# Patient Record
Sex: Female | Born: 1945 | Race: Black or African American | Hispanic: No | Marital: Single | Smoking: Current every day smoker
Health system: Southern US, Community
[De-identification: ages and names within clinical notes are randomized; demographics above are authoritative.]

## PROBLEM LIST (undated history)

## (undated) DIAGNOSIS — F4000 Agoraphobia, unspecified: Secondary | ICD-10-CM

## (undated) DIAGNOSIS — I10 Essential (primary) hypertension: Secondary | ICD-10-CM

## (undated) DIAGNOSIS — F419 Anxiety disorder, unspecified: Secondary | ICD-10-CM

## (undated) HISTORY — PX: ABDOMINAL HYSTERECTOMY: SHX81

---

## 2012-10-31 HISTORY — PX: TOTAL KNEE ARTHROPLASTY: SHX125

## 2013-08-23 ENCOUNTER — Emergency Department (HOSPITAL_COMMUNITY): Payer: Medicare Other

## 2013-08-23 ENCOUNTER — Observation Stay (HOSPITAL_COMMUNITY)
Admission: EM | Admit: 2013-08-23 | Discharge: 2013-08-24 | Disposition: A | Discharge status: Home / Self Care | Payer: Medicare Other | Attending: Family Medicine | Admitting: Family Medicine

## 2013-08-23 ENCOUNTER — Encounter (HOSPITAL_COMMUNITY): Payer: Self-pay | Admitting: Emergency Medicine

## 2013-08-23 DIAGNOSIS — S93402A Sprain of unspecified ligament of left ankle, initial encounter: Secondary | ICD-10-CM

## 2013-08-23 DIAGNOSIS — S82851A Displaced trimalleolar fracture of right lower leg, initial encounter for closed fracture: Secondary | ICD-10-CM

## 2013-08-23 DIAGNOSIS — F411 Generalized anxiety disorder: Secondary | ICD-10-CM | POA: Insufficient documentation

## 2013-08-23 DIAGNOSIS — Z96659 Presence of unspecified artificial knee joint: Secondary | ICD-10-CM | POA: Insufficient documentation

## 2013-08-23 DIAGNOSIS — S82843A Displaced bimalleolar fracture of unspecified lower leg, initial encounter for closed fracture: Principal | ICD-10-CM | POA: Diagnosis present

## 2013-08-23 DIAGNOSIS — S8392XA Sprain of unspecified site of left knee, initial encounter: Secondary | ICD-10-CM

## 2013-08-23 DIAGNOSIS — F17200 Nicotine dependence, unspecified, uncomplicated: Secondary | ICD-10-CM | POA: Insufficient documentation

## 2013-08-23 DIAGNOSIS — F4000 Agoraphobia, unspecified: Secondary | ICD-10-CM | POA: Insufficient documentation

## 2013-08-23 DIAGNOSIS — I10 Essential (primary) hypertension: Secondary | ICD-10-CM | POA: Diagnosis present

## 2013-08-23 DIAGNOSIS — X500XXA Overexertion from strenuous movement or load, initial encounter: Secondary | ICD-10-CM | POA: Insufficient documentation

## 2013-08-23 HISTORY — DX: Agoraphobia, unspecified: F40.00

## 2013-08-23 HISTORY — DX: Essential (primary) hypertension: I10

## 2013-08-23 HISTORY — DX: Anxiety disorder, unspecified: F41.9

## 2013-08-23 LAB — I-STAT CHEM 8, ED
BUN: 11 mg/dL (ref 6–23)
CALCIUM ION: 1.15 mmol/L (ref 1.13–1.30)
Chloride: 108 mEq/L (ref 96–112)
Creatinine, Ser: 0.7 mg/dL (ref 0.50–1.10)
GLUCOSE: 108 mg/dL — AB (ref 70–99)
HEMATOCRIT: 46 % (ref 36.0–46.0)
HEMOGLOBIN: 15.6 g/dL — AB (ref 12.0–15.0)
Potassium: 4.9 mEq/L (ref 3.7–5.3)
Sodium: 141 mEq/L (ref 137–147)
TCO2: 23 mmol/L (ref 0–100)

## 2013-08-23 LAB — CBC WITH DIFFERENTIAL/PLATELET
BASOS PCT: 0 % (ref 0–1)
Basophils Absolute: 0 10*3/uL (ref 0.0–0.1)
EOS ABS: 0.1 10*3/uL (ref 0.0–0.7)
EOS PCT: 1 % (ref 0–5)
HEMATOCRIT: 41.6 % (ref 36.0–46.0)
HEMOGLOBIN: 14.1 g/dL (ref 12.0–15.0)
LYMPHS ABS: 2.8 10*3/uL (ref 0.7–4.0)
Lymphocytes Relative: 31 % (ref 12–46)
MCH: 30.1 pg (ref 26.0–34.0)
MCHC: 33.9 g/dL (ref 30.0–36.0)
MCV: 88.7 fL (ref 78.0–100.0)
MONO ABS: 0.7 10*3/uL (ref 0.1–1.0)
MONOS PCT: 8 % (ref 3–12)
Neutro Abs: 5.5 10*3/uL (ref 1.7–7.7)
Neutrophils Relative %: 60 % (ref 43–77)
Platelets: 214 10*3/uL (ref 150–400)
RBC: 4.69 MIL/uL (ref 3.87–5.11)
RDW: 15.4 % (ref 11.5–15.5)
WBC: 9.1 10*3/uL (ref 4.0–10.5)

## 2013-08-23 MED ORDER — OXYCODONE-ACETAMINOPHEN 5-325 MG PO TABS
1.0000 | ORAL_TABLET | Freq: Once | ORAL | Status: AC
  Administered 2013-08-23: 1 via ORAL
  Filled 2013-08-23: qty 1

## 2013-08-23 MED ORDER — HYDROMORPHONE HCL PF 1 MG/ML IJ SOLN
1.0000 mg | Freq: Once | INTRAMUSCULAR | Status: AC
  Administered 2013-08-23: 1 mg via INTRAVENOUS
  Filled 2013-08-23: qty 1

## 2013-08-23 MED ORDER — FENTANYL CITRATE 0.05 MG/ML IJ SOLN
50.0000 ug | Freq: Once | INTRAMUSCULAR | Status: AC
  Administered 2013-08-23: 50 ug via INTRAVENOUS
  Filled 2013-08-23: qty 2

## 2013-08-23 NOTE — Progress Notes (Signed)
  CARE MANAGEMENT ED NOTE 08/23/2013  Patient:  Larina EarthlyDUNMORE,Jennessa   Account Number:  [id number]  Date Initiated:  08/23/2013  Documentation initiated by:  Radford PaxFERRERO,Onur Mori  Subjective/Objective Assessment:   Patient presents to Ed with bilateral ankle pain     Subjective/Objective Assessment Detail:   xray of right ankle revealed:    1. Bimalleolar fracture. Fracture fragments are slightly displaced.  A subtle posterior malleolar component cannot be excluded.  2. DJD.  Patient has history of left kne replacement.     Action/Plan:   Action/Plan Detail:   Anticipated DC Date:       Status Recommendation to Physician:   Result of Recommendation:    Other ED Services  Consult Working Plan    DC Planning Services  CM consult  Other    Choice offered to / List presented to:    DME arranged  WHEELCHAIR - MANUAL     DME agency  [facility]  [facility]        Status of service:  Completed, signed off  ED Comments:   ED Comments Detail:  [facility] to [facility] patient regarding a wheelchair.  Patient lost her balance walking up the stairs and broke her right ankle.  Patient's friends willing to take her home, only the patient needs a wheelchair. Patient is from out of state, lives in [place].  EDCM spoke to patient's friends at bedside who report the patient's sister will be oming to pick her up on Monday. Patient would like to see her orthopedist in [place]. Patient is 5'6' 255pounds.  DME order placed for wheelchiar and faxed over to [facility] at 1840pm with confirmation of receipt at 1844pm, with urgent message written on face sheet [facility] received a phone call from Villa PanchoJason of [facility] at 1857pm who reports they will not be able to deliver the wheelchair this evening because the patient's address is not within their service area.  Barbara CowerJason of [facility] suggested [facility].  EDCM called [facility] at phone number [phone] and  spoke to [place]Erica who took all of patient's demographic information and insurance information.  EDCM was informed by Alcario DroughtErica to fax face sheet, doctors notes, insurance information and prescriptions to phone number [phone].  [facility] faxed the information requested at 1926pm with confirmation of receipt at 1931pm.  [facility] made patient and her friends aware of the above situation.  As per Janna Archracy of [facility], wheelchair may be delivered this evening, they have to process the order and wait for insurance authorization.  French Anaracy provided this phone number to follow up on process of wheelchair order [phone]. No further EDCM needs at this time.

## 2013-08-23 NOTE — ED Provider Notes (Signed)
CSN: [id number]     Arrival date & time 08/23/13  1714 History   First MD Initiated Contact with Patient 08/23/13 1721     Chief Complaint  Patient presents with  . Fall  . Ankle Injury     (Consider location/radiation/quality/duration/timing/severity/associated sxs/prior Treatment) HPI Erin Rose is a 68 y.o. female who presents to ED with complaint of a fall. Pt states she was walking up the stairs and fell twisting bilateral ankles and injuring left knee. Pt denies dizziness, light headiness, syncope. Pt denies any other injuries. No pain in her back, neck, head. Did not hit head or have LOC. Denies hip or pelvis pain. Hx of bilateral ankle fractures and left knee replacement. Pt was immobilized by EMS, possible unstable left ankle joint. Was treated with fentanyl for pain.    Past Medical History  Diagnosis Date  . Hypertension   . Anxiety   . Agoraphobia    Past Surgical History  Procedure Laterality Date  . Total knee arthroplasty    . Abdominal hysterectomy     No family history on file. History  Substance Use Topics  . Smoking status: Current Every Day Smoker -- 1.00 packs/day    Types: Cigarettes  . Smokeless tobacco: Not on file  . Alcohol Use: Yes     Comment: occ.   OB History   Grav Para Term Preterm Abortions TAB SAB Ect Mult Living                 Review of Systems  Constitutional: Negative for fever and chills.  Respiratory: Negative for cough, chest tightness and shortness of breath.   Cardiovascular: Negative for chest pain, palpitations and leg swelling.  Gastrointestinal: Negative for nausea, vomiting, abdominal pain and diarrhea.  Genitourinary: Negative for dysuria and flank pain.  Musculoskeletal: Positive for arthralgias and joint swelling. Negative for myalgias, neck pain and neck stiffness.  Skin: Negative for rash.  Neurological: Negative for dizziness, weakness and headaches.  All other systems reviewed and are  negative.     Allergies  Review of patient's allergies indicates no known allergies.  Home Medications   Prior to Admission medications   Not on File   There were no vitals taken for this visit. Physical Exam  Nursing note and vitals reviewed. Constitutional: She appears well-developed and well-nourished. No distress.  HENT:  Head: Normocephalic.  Eyes: Conjunctivae are normal.  Neck: Neck supple.  Pulmonary/Chest: Effort normal. No respiratory distress.  Abdominal: She exhibits no distension.  Musculoskeletal: She exhibits no edema.  Swelling over bilateral ankle joints. Tender to palpation over right and left lateral maleoli. Pain with any ROM at ankle joints. Dorsal pedal pulses intact bilaterally. Achilles tendons intact bilaterally. Left knee normal appearing. Tender to palpation over anterior knee. No medial or lateral joint tenderness. Did not check ROM due to pain.   Neurological: She is alert.  Skin: Skin is warm and dry.  Psychiatric: She has a normal mood and affect. Her behavior is normal.    ED Course  Procedures (including critical care time) Labs Review Labs Reviewed  I-STAT CHEM 8, ED - Abnormal; Notable for the following:    Glucose, Bld 108 (*)    Hemoglobin 15.6 (*)    All other components within normal limits  CBC WITH DIFFERENTIAL    Imaging Review Dg Ankle Complete Left  08/23/2013   CLINICAL DATA:  Ankle pain after falling today.  EXAM: LEFT ANKLE COMPLETE - 3+ VIEW  COMPARISON:  None.  FINDINGS: The bones appear mildly demineralized. There is no evidence of acute fracture or dislocation. There is mild spurring of both malleoli and moderate calcaneal spurring. The soft tissues surrounding the ankle are diffusely prominent without apparent focal swelling.  IMPRESSION: No acute osseous findings.   Electronically Signed   By: Roxy HorsemanBill  Veazey M.D.   On: 08/23/2013 17:59   Dg Ankle Complete Right  08/23/2013   CLINICAL DATA:  Fall.  EXAM: RIGHT ANKLE -  COMPLETE 3+ VIEW  COMPARISON:  None.  FINDINGS: Severe soft tissue swelling present. Bimalleolar fractures are present. The fractures are slightly displaced. Trimalleolar component cannot be completely excluded. Diffuse osteopenia and degenerative change present.  IMPRESSION: 1. Bimalleolar fracture. Fracture fragments are slightly displaced. A subtle posterior malleolar component cannot be excluded. 2. DJD.   Electronically Signed   By: Maisie Fushomas  Register   On: 08/23/2013 18:00   Dg Knee Complete 4 Views Left  08/23/2013   CLINICAL DATA:  Fall.  Left knee pain.  EXAM: LEFT KNEE - COMPLETE 4+ VIEW  COMPARISON:  None.  FINDINGS: Changes of left knee replacement. No hardware or bony complicating feature. No fracture, subluxation or dislocation.  IMPRESSION: No acute bony abnormality.   Electronically Signed   By: Charlett NoseKevin  Dover M.D.   On: 08/23/2013 17:59     EKG Interpretation None      MDM   Final diagnoses:  Trimalleolar fracture of right ankle  Sprain of left ankle  Sprain of left knee    Patient bilateral ankle and left knee injuries. X-rays obtained, the right ankle unstable possible a trimalleolar fracture. Left ankle and knee x-rays negative. Patient unable to bear weight to both legs. Patient is from out of town, from [place]. She does have orthopedics Dr. there. Patient is to go back in 3 days. Offered to call in on-call orthopedist Dr. here, however patient would prefer to followup with her orthopedic Dr. when she gets back home. Explained to her she will need surgery. This time we'll try to get patient a wheelchair to go home with. She is unable to walk with crutches or walker.  9:04 PM Spoke with Dr. Luiz BlareGraves, advised splinting, admit for pain management to medicine if needed, he will be happy to operate next week if pt chooses to stay in [place], otherwise disposition home.   Difficulty getting a wheel chair, Child psychotherapistsocial worker and case manager working on it. Pt admitted for pain  management   Filed Vitals:   08/23/13 1745 08/23/13 1813 08/23/13 2039 08/23/13 2330  BP: 148/89 160/87 159/68 177/62  Pulse: 88 79 80 59  Temp: 98.4 F (36.9 C) 98 F (36.7 C) 97.9 F (36.6 C) 98.1 F (36.7 C)  TempSrc:   Oral Oral  Resp: 18 18 20 20   Height:  5\' 6"  (1.676 m)    Weight:  255 lb (115.667 kg)    SpO2: 100% 97% 95% 96%     Josanne Boerema A Silvano Garofano, PA-C 08/24/13 0100

## 2013-08-23 NOTE — ED Notes (Signed)
Ortho tech at bedside 

## 2013-08-23 NOTE — ED Notes (Signed)
Bed: ZO10WA10 Expected date:  Expected time:  Means of arrival:  Comments: ems- fall

## 2013-08-23 NOTE — ED Notes (Addendum)
Per EMS pt was walking up the stairs, lost balance, fell, reports bilateral ankle pain, per EMS weak pedal pulses, "right ankle unstable" fentanyl given en route

## 2013-08-24 ENCOUNTER — Encounter (HOSPITAL_COMMUNITY): Payer: Self-pay | Admitting: Internal Medicine

## 2013-08-24 DIAGNOSIS — S82843A Displaced bimalleolar fracture of unspecified lower leg, initial encounter for closed fracture: Secondary | ICD-10-CM

## 2013-08-24 DIAGNOSIS — S82853A Displaced trimalleolar fracture of unspecified lower leg, initial encounter for closed fracture: Secondary | ICD-10-CM

## 2013-08-24 DIAGNOSIS — I10 Essential (primary) hypertension: Secondary | ICD-10-CM | POA: Diagnosis present

## 2013-08-24 DIAGNOSIS — S93409A Sprain of unspecified ligament of unspecified ankle, initial encounter: Secondary | ICD-10-CM

## 2013-08-24 LAB — COMPREHENSIVE METABOLIC PANEL
ALK PHOS: 76 U/L (ref 39–117)
ALT: 12 U/L (ref 0–35)
AST: 13 U/L (ref 0–37)
Albumin: 3.2 g/dL — ABNORMAL LOW (ref 3.5–5.2)
BILIRUBIN TOTAL: 0.5 mg/dL (ref 0.3–1.2)
BUN: 8 mg/dL (ref 6–23)
CO2: 21 mEq/L (ref 19–32)
Calcium: 8.8 mg/dL (ref 8.4–10.5)
Chloride: 104 mEq/L (ref 96–112)
Creatinine, Ser: 0.54 mg/dL (ref 0.50–1.10)
GFR calc non Af Amer: >90 mL/min (ref >90)
Glucose, Bld: 108 mg/dL — ABNORMAL HIGH (ref 70–99)
POTASSIUM: 3.4 meq/L — AB (ref 3.7–5.3)
Sodium: 138 mEq/L (ref 137–147)
TOTAL PROTEIN: 6.9 g/dL (ref 6.0–8.3)

## 2013-08-24 LAB — CBC
HEMATOCRIT: 39.8 % (ref 36.0–46.0)
Hemoglobin: 13.7 g/dL (ref 12.0–15.0)
MCH: 30.7 pg (ref 26.0–34.0)
MCHC: 34.4 g/dL (ref 30.0–36.0)
MCV: 89.2 fL (ref 78.0–100.0)
Platelets: 195 10*3/uL (ref 150–400)
RBC: 4.46 MIL/uL (ref 3.87–5.11)
RDW: 15.6 % — AB (ref 11.5–15.5)
WBC: 7.9 10*3/uL (ref 4.0–10.5)

## 2013-08-24 LAB — MAGNESIUM: Magnesium: 1.8 mg/dL (ref 1.5–2.5)

## 2013-08-24 LAB — TSH: TSH: 3.5 u[IU]/mL (ref 0.350–4.500)

## 2013-08-24 LAB — PHOSPHORUS: Phosphorus: 2.6 mg/dL (ref 2.3–4.6)

## 2013-08-24 MED ORDER — HYDROMORPHONE HCL PF 1 MG/ML IJ SOLN
0.5000 mg | INTRAMUSCULAR | Status: DC | PRN
  Administered 2013-08-24 (×2): 1 mg via INTRAVENOUS
  Filled 2013-08-24 (×2): qty 1

## 2013-08-24 MED ORDER — LOSARTAN POTASSIUM 25 MG PO TABS
25.0000 mg | ORAL_TABLET | Freq: Every day | ORAL | Status: DC
  Administered 2013-08-24: 25 mg via ORAL
  Filled 2013-08-24: qty 1

## 2013-08-24 MED ORDER — OXYCODONE-ACETAMINOPHEN 5-325 MG PO TABS
1.0000 | ORAL_TABLET | ORAL | Status: DC | PRN
  Administered 2013-08-24 (×2): 1 via ORAL
  Filled 2013-08-24 (×3): qty 1

## 2013-08-24 MED ORDER — ACETAMINOPHEN 325 MG PO TABS: 650.0000 mg | ORAL_TABLET | Freq: Four times a day (QID) | ORAL | Status: DC | PRN

## 2013-08-24 MED ORDER — OXYCODONE-ACETAMINOPHEN 5-325 MG PO TABS: 1.0000 | ORAL_TABLET | ORAL | Status: AC | PRN

## 2013-08-24 MED ORDER — DOCUSATE SODIUM 100 MG PO CAPS
100.0000 mg | ORAL_CAPSULE | Freq: Two times a day (BID) | ORAL | Status: DC
  Administered 2013-08-24: 100 mg via ORAL

## 2013-08-24 MED ORDER — ACETAMINOPHEN 650 MG RE SUPP: 650.0000 mg | Freq: Four times a day (QID) | RECTAL | Status: DC | PRN

## 2013-08-24 MED ORDER — NIFEDIPINE ER OSMOTIC RELEASE 90 MG PO TB24
90.0000 mg | ORAL_TABLET | Freq: Every day | ORAL | Status: DC
  Administered 2013-08-24: 90 mg via ORAL
  Filled 2013-08-24: qty 1

## 2013-08-24 MED ORDER — ENOXAPARIN SODIUM 40 MG/0.4ML ~~LOC~~ SOLN
40.0000 mg | Freq: Every day | SUBCUTANEOUS | Status: DC
  Administered 2013-08-24: 40 mg via SUBCUTANEOUS
  Filled 2013-08-24 (×2): qty 0.4

## 2013-08-24 MED ORDER — CITALOPRAM HYDROBROMIDE 20 MG PO TABS
20.0000 mg | ORAL_TABLET | Freq: Every day | ORAL | Status: DC
  Administered 2013-08-24: 20 mg via ORAL
  Filled 2013-08-24: qty 1

## 2013-08-24 MED ORDER — ONDANSETRON HCL 4 MG/2ML IJ SOLN
4.0000 mg | Freq: Four times a day (QID) | INTRAMUSCULAR | Status: DC | PRN
Start: 2013-08-24 — End: 2013-08-24

## 2013-08-24 MED ORDER — ONDANSETRON HCL 4 MG PO TABS: 4.0000 mg | ORAL_TABLET | Freq: Four times a day (QID) | ORAL | Status: DC | PRN

## 2013-08-24 MED ORDER — LORAZEPAM 1 MG PO TABS: 1.0000 mg | ORAL_TABLET | Freq: Four times a day (QID) | ORAL | Status: AC | PRN

## 2013-08-24 MED ORDER — OXYCODONE-ACETAMINOPHEN 5-325 MG PO TABS
1.0000 | ORAL_TABLET | ORAL | Status: DC | PRN
  Administered 2013-08-24 (×2): 1 via ORAL
  Filled 2013-08-24: qty 1

## 2013-08-24 MED ORDER — LORAZEPAM 1 MG PO TABS
1.0000 mg | ORAL_TABLET | Freq: Four times a day (QID) | ORAL | Status: DC | PRN
  Administered 2013-08-24: 1 mg via ORAL
  Filled 2013-08-24: qty 1

## 2013-08-24 MED ORDER — BUSPIRONE HCL 15 MG PO TABS
15.0000 mg | ORAL_TABLET | Freq: Three times a day (TID) | ORAL | Status: DC
  Administered 2013-08-24: 15 mg via ORAL
  Filled 2013-08-24 (×3): qty 1

## 2013-08-24 NOTE — Progress Notes (Signed)
Contacted [facility] for wheelchair for home. Tariya Morrissette RN CCM Case Mgmt phone [phone] 

## 2013-08-24 NOTE — Progress Notes (Signed)
Clinical Social Work Department BRIEF PSYCHOSOCIAL ASSESSMENT 08/24/2013  Patient:  Erin Rose, Erin Rose     Account Number:  [id number]     Admit date:  08/23/2013  Clinical Social Worker:  Levie Heritage  Date/Time:  08/24/2013 11:03 AM  Referred by:  Physician  Date Referred:  08/24/2013 Referred for  Transportation assistance   Other Referral:   Interview type:  Patient Other interview type:    PSYCHOSOCIAL DATA Living Status:  FAMILY Admitted from facility:   Level of care:   Primary support name:  Olivia Mackie Primary support relationship to patient:  FRIEND Degree of support available:   strong    CURRENT CONCERNS Current Concerns  Other - See comment   Other Concerns:   Transportation    SOCIAL WORK ASSESSMENT / PLAN CSW met with Pt to discuss d/c.    Pt stated that she was here from [place] for her niece's wedding.  Pt stated that she came by train and that she has a ticket to go back by train on Tuesday but ste doesn't feel that she can withstand travel by train or car.  Pt stated that she feels that she needs to be transported by ambulance.  Her daughter is at her home and will receive her.  From there, she will seek treatment for her injuries.    Pt asked CSW to arrange ambulance transport, should MD feel that Pt is ready.  Pt is wanting to know an approximate ambulance charge.    CSW will look into this.    CSW thanked Pt for her time.    Per MD, Pt can d/c today.    CSW spoke with Midwife.  Informed that [facility] charges a flat rate of $275 and $9.50 per mile.  Pt's total came to approx $1,624.    Notified Pt with MD present.  Pt voiced an understanding and asked CSW to proceed with arranging transportation.    CSW arranged for ambulance transportation home.    No further CSW needs identified.    CSW to sign off.   Assessment/plan status:  No Further Intervention Required Other assessment/ plan:   Information/referral to community resources:    n/a    PATIENT'S/FAMILY'S RESPONSE TO PLAN OF CARE: Pt was calm, cooperative and very pleasant.  Pt is happy to d/c home and, although she isn't too thrilled about the cost, she did state that it was less than she anticipated.   Bernita Raisin, [place] Social Work [phone]

## 2013-08-24 NOTE — H&P (Signed)
PCP: Erin Rose   Chief Complaint:  Leg pain  HPI: Erin Rose is a 68 y.o. female   has a past medical history of Hypertension; Anxiety; and Agoraphobia.   Presented with  patient visiting from [place] had a mechanical fall resulting in Right Bimalleolar ankle fracture. She is wishing to return home to [place] for operative intervention. ER spoke to Orthopedics who feels no emergent repair needed at his time. patient was placed in a splint. She is been placed in observation for pain control until arrangement for transportation to [place] is established. She is sp Left knee replacement 10 months ago and have had some left knee and left ankle pain as well.   Hospitalist was called for admission for pain control  Review of Systems:    Pertinent positives include:  Leg pain  Constitutional:  No weight loss, night sweats, Fevers, chills, fatigue, weight loss  HEENT:  No headaches, Difficulty swallowing,Tooth/dental problems,Sore throat,  No sneezing, itching, ear ache, nasal congestion, post nasal drip,  Cardio-vascular:  No chest pain, Orthopnea, PND, anasarca, dizziness, palpitations.no Bilateral lower extremity swelling  GI:  No heartburn, indigestion, abdominal pain, nausea, vomiting, diarrhea, change in bowel habits, loss of appetite, melena, blood in stool, hematemesis Resp:  no shortness of breath at rest. No dyspnea on exertion, No excess mucus, no productive cough, No non-productive cough, No coughing up of blood.No change in color of mucus.No wheezing. Skin:  no rash or lesions. No jaundice GU:  no dysuria, change in color of urine, no urgency or frequency. No straining to urinate.  No flank pain.  Musculoskeletal:  No joint pain or no joint swelling. No decreased range of motion. No back pain.  Psych:  No change in mood or affect. No depression or anxiety. No memory loss.  Neuro: no localizing neurological complaints, no tingling, no weakness, no double  vision, no gait abnormality, no slurred speech, no confusion  Otherwise ROS are negative except for above, 10 systems were reviewed  Past Medical History: Past Medical History  Diagnosis Date  . Hypertension   . Anxiety   . Agoraphobia    Past Surgical History  Procedure Laterality Date  . Total knee arthroplasty Left 10/31/2012  . Abdominal hysterectomy       Medications: Prior to Admission medications   Medication Sig Start Date End Date Taking? Authorizing Provider  busPIRone (BUSPAR) 15 MG tablet Take 15 mg by mouth 3 (three) times daily.   Yes Historical Provider, MD  citalopram (CELEXA) 20 MG tablet Take 20 mg by mouth daily.   Yes Historical Provider, MD  LORazepam (ATIVAN) 1 MG tablet Take 1 mg by mouth 4 (four) times daily.   Yes Historical Provider, MD  losartan (COZAAR) 25 MG tablet Take 25 mg by mouth daily.   Yes Historical Provider, MD  NIFEdipine (PROCARDIA XL/ADALAT-CC) 90 MG 24 hr tablet Take 90 mg by mouth daily.   Yes Historical Provider, MD  pravastatin (PRAVACHOL) 20 MG tablet Take 20 mg by mouth every evening.   Yes Historical Provider, MD    Allergies:  No Known Allergies  Social History:  Ambulatory cane,  Lives at home  With family    reports that she has been smoking Cigarettes.  She has been smoking about 1.00 pack per day. She does not have any smokeless tobacco history on file. She reports that she drinks alcohol. She reports that she does not use illicit drugs.    Family History: family history includes Breast cancer  in her daughter; Dementia in her mother; Prostate cancer in her son; Stroke in her mother.    Physical Exam: Patient Vitals for the past 24 hrs:  BP Temp Temp src Pulse Resp SpO2 Height Weight  08/23/13 2330 177/62 mmHg 98.1 F (36.7 C) Oral 59 20 96 % - -  08/23/13 2039 159/68 mmHg 97.9 F (36.6 C) Oral 80 20 95 % - -  08/23/13 1813 160/87 mmHg 98 F (36.7 C) - 79 18 97 % 5\' 6"  (1.676 m) 115.667 kg (255 lb)  08/23/13  1745 148/89 mmHg 98.4 F (36.9 C) - 88 18 100 % - -    1. General:  in No Acute distress 2. Psychological: Alert and   Oriented 3. Head/ENT:   Moist  Mucous Membranes                          Head Non traumatic, neck supple                          Normal   Dentition 4. SKIN: normal Skin turgor,  Skin clean Dry and intact no rash 5. Heart: Regular rate and rhythm no Murmur, Rub or gallop 6. Lungs: Clear to auscultation bilaterally, no wheezes or crackles   7. Abdomen: Soft, non-tender, Non distended 8. Lower extremities: no clubbing, cyanosis, or edema, left knee with well healing scar from knee replacement, a brace on her left ankle. Right ankle in a cast 9. Neurologically Grossly intact, moving all 4 extremities equally limited by pain 10. MSK: Normal range of motion  body mass index is 41.18 kg/(m^2).   Labs on Admission:   Recent Labs  08/23/13 2233  NA 141  K 4.9  CL 108  GLUCOSE 108*  BUN 11  CREATININE 0.70   No results found for this basename: AST, ALT, ALKPHOS, BILITOT, PROT, ALBUMIN,  in the last 72 hours No results found for this basename: LIPASE, AMYLASE,  in the last 72 hours  Recent Labs  08/23/13 2220 08/23/13 2233  WBC 9.1  --   NEUTROABS 5.5  --   HGB 14.1 15.6*  HCT 41.6 46.0  MCV 88.7  --   PLT 214  --    No results found for this basename: CKTOTAL, CKMB, CKMBINDEX, TROPONINI,  in the last 72 hours No results found for this basename: TSH, T4TOTAL, FREET3, T3FREE, THYROIDAB,  in the last 72 hours No results found for this basename: VITAMINB12, FOLATE, FERRITIN, TIBC, IRON, RETICCTPCT,  in the last 72 hours No results found for this basename: HGBA1C    Estimated Creatinine Clearance: 88.2 ml/min (by C-G formula based on Cr of 0.7). ABG    Component Value Date/Time   TCO2 23 08/23/2013 2233     No results found for this basename: DDIMER   BNP (last 3 results) No results found for this basename: PROBNP,  in the last 8760 hours  Filed  Weights   08/23/13 1813  Weight: 115.667 kg (255 lb)     Cultures: No results found for this basename: sdes, specrequest, cult, reptstatus   Radiological Exams on Admission: Dg Ankle Complete Left  08/23/2013   CLINICAL DATA:  Ankle pain after falling today.  EXAM: LEFT ANKLE COMPLETE - 3+ VIEW  COMPARISON:  None.  FINDINGS: The bones appear mildly demineralized. There is no evidence of acute fracture or dislocation. There is mild spurring of both malleoli and moderate calcaneal  spurring. The soft tissues surrounding the ankle are diffusely prominent without apparent focal swelling.  IMPRESSION: No acute osseous findings.   Electronically Signed   By: Roxy HorsemanBill  Veazey M.D.   On: 08/23/2013 17:59   Dg Ankle Complete Right  08/23/2013   CLINICAL DATA:  Fall.  EXAM: RIGHT ANKLE - COMPLETE 3+ VIEW  COMPARISON:  None.  FINDINGS: Severe soft tissue swelling present. Bimalleolar fractures are present. The fractures are slightly displaced. Trimalleolar component cannot be completely excluded. Diffuse osteopenia and degenerative change present.  IMPRESSION: 1. Bimalleolar fracture. Fracture fragments are slightly displaced. A subtle posterior malleolar component cannot be excluded. 2. DJD.   Electronically Signed   By: Maisie Fushomas  Register   On: 08/23/2013 18:00   Dg Knee Complete 4 Views Left  08/23/2013   CLINICAL DATA:  Fall.  Left knee pain.  EXAM: LEFT KNEE - COMPLETE 4+ VIEW  COMPARISON:  None.  FINDINGS: Changes of left knee replacement. No hardware or bony complicating feature. No fracture, subluxation or dislocation.  IMPRESSION: No acute bony abnormality.   Electronically Signed   By: Charlett NoseKevin  Dover M.D.   On: 08/23/2013 17:59    Chart has been reviewed  Assessment/Plan 68 year old female history of hypertension present soft mechanical fall resulting in right ankle fracture and left ankle sprain unable to weight-bear as needed for pain control with plan to be transported back to [place] for further  intervention  Present on Admission:  . Ankle fracture, bimalleolar, closed - ER has spoken to orthopedics. At this point there is no need for emergent operative intervention. Patient requests transfer to [place]. We will achieve pain control social work consult for transportation needs  . Hypertension continue home medications   Prophylaxis:   Lovenox    CODE STATUS:  FULL CODE     Other plan as per orders.  I have spent a total of 55 min on this admission  Sammie Schermerhorn 08/24/2013, 12:16 AM  [facility]  Pager [phone]   If 7AM-7PM, please contact the day team taking care of the patient  [email or web address]  Password [email or web address]

## 2013-08-24 NOTE — Progress Notes (Signed)
Patient discharged to home in [place].  Discharge paperwork reviewed with patient by previous RN Malen GauzeKim G.  Prescription for Percocet given to patient.  [facility]  Patient escorted from floor via stretcher by ambulance service.  Dorothyann PengKim Talana Slatten RN

## 2013-08-24 NOTE — ED Provider Notes (Signed)
Medical screening examination/treatment/procedure(s) were performed by non-physician practitioner and as supervising physician I was immediately available for consultation/collaboration.   EKG Interpretation None        Richardean Canalavid H Yao, MD 08/24/13 1504

## 2013-08-24 NOTE — Progress Notes (Signed)
08/24/2013 1630 [facility] delivered wheelchair to room. Ambulance unable to transport wheelchair. [facility] cannot deliver to [place]. Contacted [facility] and they are not able to deliver wheelchair until Monday, 08/24/2013. Will follow up with [facility] for delivery of DME tonight. Spoke to rep for [employer] [facility] and they do not cover that area. Attempted call to DME in [place]. [facility] No answer. Will follow up 08/25/2013. Isidoro DonningAlesia Cannan Beeck RN CCM Case Mgmt phone [phone]

## 2013-08-24 NOTE — Discharge Summary (Signed)
Physician Discharge Summary  Erin Rose AVW:[id number] DOB: [birth year] DOA: 08/23/2013  PCP: No primary provider on file.  Admit date: 08/23/2013 Discharge date: 08/24/2013  Time spent: > 35 minutes  Recommendations for Outpatient Follow-up:  1. Please be sure to follow up with your orthopaedic surgeon for further evaluation and recommendations.  Discharge Diagnoses:  Active Problems:   Ankle fracture, bimalleolar, closed   Hypertension   Discharge Condition: stable  Diet recommendation: low sodium heart healthy  Filed Weights   08/23/13 1813  Weight: 115.667 kg (255 lb)    History of present illness:  68 y/o patient visiting from [place] had a mechanical fall resulting in Right Bimalleolar ankle fracture. Was admitted for pain control.  Hospital Course:  Right bimalleolar ankle fracture - Will prescribe Percocet for pain control. I have advised patient to take her anxiety medication as needed and to avoid taking it if possible given that combination of benzodiazepine and opioid may make patient more drowsy. - Patient reports she will followup with orthopedic surgeon within the next 5 days or sooner - Plan will be to discharge patient that she can go to [place] followup with her orthopedic surgeon of her choice. As per her request. - EMR ER discuss case with orthopedic surgeon did not recommend emergent operative intervention.  HTN - continue home regimen.  Procedures:  None  Consultations:  None  Discharge Exam: Filed Vitals:   08/24/13 0504  BP: 157/82  Pulse: 85  Temp: 98.3 F (36.8 C)  Resp: 16    General: Pt in NAD, alert and awake Cardiovascular: RRR, no MRG Respiratory: CTA BL, no wheezes  Discharge Instructions You were cared for by a hospitalist during your hospital stay. If you have any questions about your discharge medications or the care you received while you were in the hospital after you are discharged, you can call the unit and asked to  speak with the hospitalist on call if the hospitalist that took care of you is not available. Once you are discharged, your primary care physician will handle any further medical issues. Please note that NO REFILLS for any discharge medications will be authorized once you are discharged, as it is imperative that you return to your primary care physician (or establish a relationship with a primary care physician if you do not have one) for your aftercare needs so that they can reassess your need for medications and monitor your lab values.  Discharge Instructions   Call MD for:  redness, tenderness, or signs of infection (pain, swelling, redness, odor or green/yellow discharge around incision site)    Complete by:  As directed      Call MD for:  temperature >100.4    Complete by:  As directed      Diet - low sodium heart healthy    Complete by:  As directed      Discharge instructions    Complete by:  As directed   Pt reports that she is going to [facility] to have her ankle fracture evaluated by her orthopaedic surgeon.     Increase activity slowly    Complete by:  As directed             Medication List         busPIRone 15 MG tablet  Commonly known as:  BUSPAR  Take 15 mg by mouth 3 (three) times daily.     citalopram 20 MG tablet  Commonly known as:  CELEXA  Take 20 mg by  mouth daily.     LORazepam 1 MG tablet  Commonly known as:  ATIVAN  Take 1 tablet (1 mg total) by mouth every 6 (six) hours as needed for anxiety.     losartan 25 MG tablet  Commonly known as:  COZAAR  Take 25 mg by mouth daily.     NIFEdipine 90 MG 24 hr tablet  Commonly known as:  PROCARDIA XL/ADALAT-CC  Take 90 mg by mouth daily.     oxyCODONE-acetaminophen 5-325 MG per tablet  Commonly known as:  PERCOCET/ROXICET  Take 1 tablet by mouth every 4 (four) hours as needed for moderate pain.     pravastatin 20 MG tablet  Commonly known as:  PRAVACHOL  Take 20 mg by mouth every evening.       No Known  Allergies    The results of significant diagnostics from this hospitalization (including imaging, microbiology, ancillary and laboratory) are listed below for reference.    Significant Diagnostic Studies: Dg Ankle Complete Left  08/23/2013   CLINICAL DATA:  Ankle pain after falling today.  EXAM: LEFT ANKLE COMPLETE - 3+ VIEW  COMPARISON:  None.  FINDINGS: The bones appear mildly demineralized. There is no evidence of acute fracture or dislocation. There is mild spurring of both malleoli and moderate calcaneal spurring. The soft tissues surrounding the ankle are diffusely prominent without apparent focal swelling.  IMPRESSION: No acute osseous findings.   Electronically Signed   By: Roxy HorsemanBill  Veazey M.D.   On: 08/23/2013 17:59   Dg Ankle Complete Right  08/23/2013   CLINICAL DATA:  Fall.  EXAM: RIGHT ANKLE - COMPLETE 3+ VIEW  COMPARISON:  None.  FINDINGS: Severe soft tissue swelling present. Bimalleolar fractures are present. The fractures are slightly displaced. Trimalleolar component cannot be completely excluded. Diffuse osteopenia and degenerative change present.  IMPRESSION: 1. Bimalleolar fracture. Fracture fragments are slightly displaced. A subtle posterior malleolar component cannot be excluded. 2. DJD.   Electronically Signed   By: Maisie Fushomas  Register   On: 08/23/2013 18:00   Dg Knee Complete 4 Views Left  08/23/2013   CLINICAL DATA:  Fall.  Left knee pain.  EXAM: LEFT KNEE - COMPLETE 4+ VIEW  COMPARISON:  None.  FINDINGS: Changes of left knee replacement. No hardware or bony complicating feature. No fracture, subluxation or dislocation.  IMPRESSION: No acute bony abnormality.   Electronically Signed   By: Charlett NoseKevin  Dover M.D.   On: 08/23/2013 17:59    Microbiology: No results found for this or any previous visit (from the past 240 hour(s)).   Labs: Basic Metabolic Panel:  Recent Labs Lab 08/23/13 2233 08/24/13 0503  NA 141 138  K 4.9 3.4*  CL 108 104  CO2  --  21  GLUCOSE 108* 108*   BUN 11 8  CREATININE 0.70 0.54  CALCIUM  --  8.8  MG  --  1.8  PHOS  --  2.6   Liver Function Tests:  Recent Labs Lab 08/24/13 0503  AST 13  ALT 12  ALKPHOS 76  BILITOT 0.5  PROT 6.9  ALBUMIN 3.2*   No results found for this basename: LIPASE, AMYLASE,  in the last 168 hours No results found for this basename: AMMONIA,  in the last 168 hours CBC:  Recent Labs Lab 08/23/13 2220 08/23/13 2233 08/24/13 0503  WBC 9.1  --  7.9  NEUTROABS 5.5  --   --   HGB 14.1 15.6* 13.7  HCT 41.6 46.0 39.8  MCV 88.7  --  89.2  PLT 214  --  195   Cardiac Enzymes: No results found for this basename: CKTOTAL, CKMB, CKMBINDEX, TROPONINI,  in the last 168 hours BNP: BNP (last 3 results) No results found for this basename: PROBNP,  in the last 8760 hours CBG: No results found for this basename: GLUCAP,  in the last 168 hours     Signed:  Penny Pia  [facility] 08/24/2013, 12:41 PM

## 2013-08-25 NOTE — Progress Notes (Signed)
NCM contacted pt and spoke to dtr, Josue HectorVivian Swain. States she thought her mother was coming home and she spoke to her mother.  Dtr states her mother decided to go straight to the hospital after dc. She is currently at [facility] awaiting surgery at 12 noon. Explained to dtr that [facility] [employer] will deliver wheelchair on 08/26/2013. Provided dtr with contact number for [employer] to follow up on delivery of DME. Dtr states her mother will not have 24 hour assistance at home once dc. NCM explained PT will make recommendations for home once she has her surgery and they can decide on best options at that time. Explained the CSW/CM will work with getting any additional DME or placement for rehab. NCM will follow up with [employer] on 6/15 on delivery of wheelchair. Isidoro DonningAlesia Stehanie Ekstrom RN CCM Case Mgmt phone [phone]

## 2015-06-20 IMAGING — CR DG ANKLE COMPLETE 3+V*R*
3 series · 3 of 3 positions shown · non-contrast
Comparison: None.

CLINICAL DATA: Fall.

EXAM:
RIGHT ANKLE - COMPLETE 3+ VIEW

[x ankle ap right]
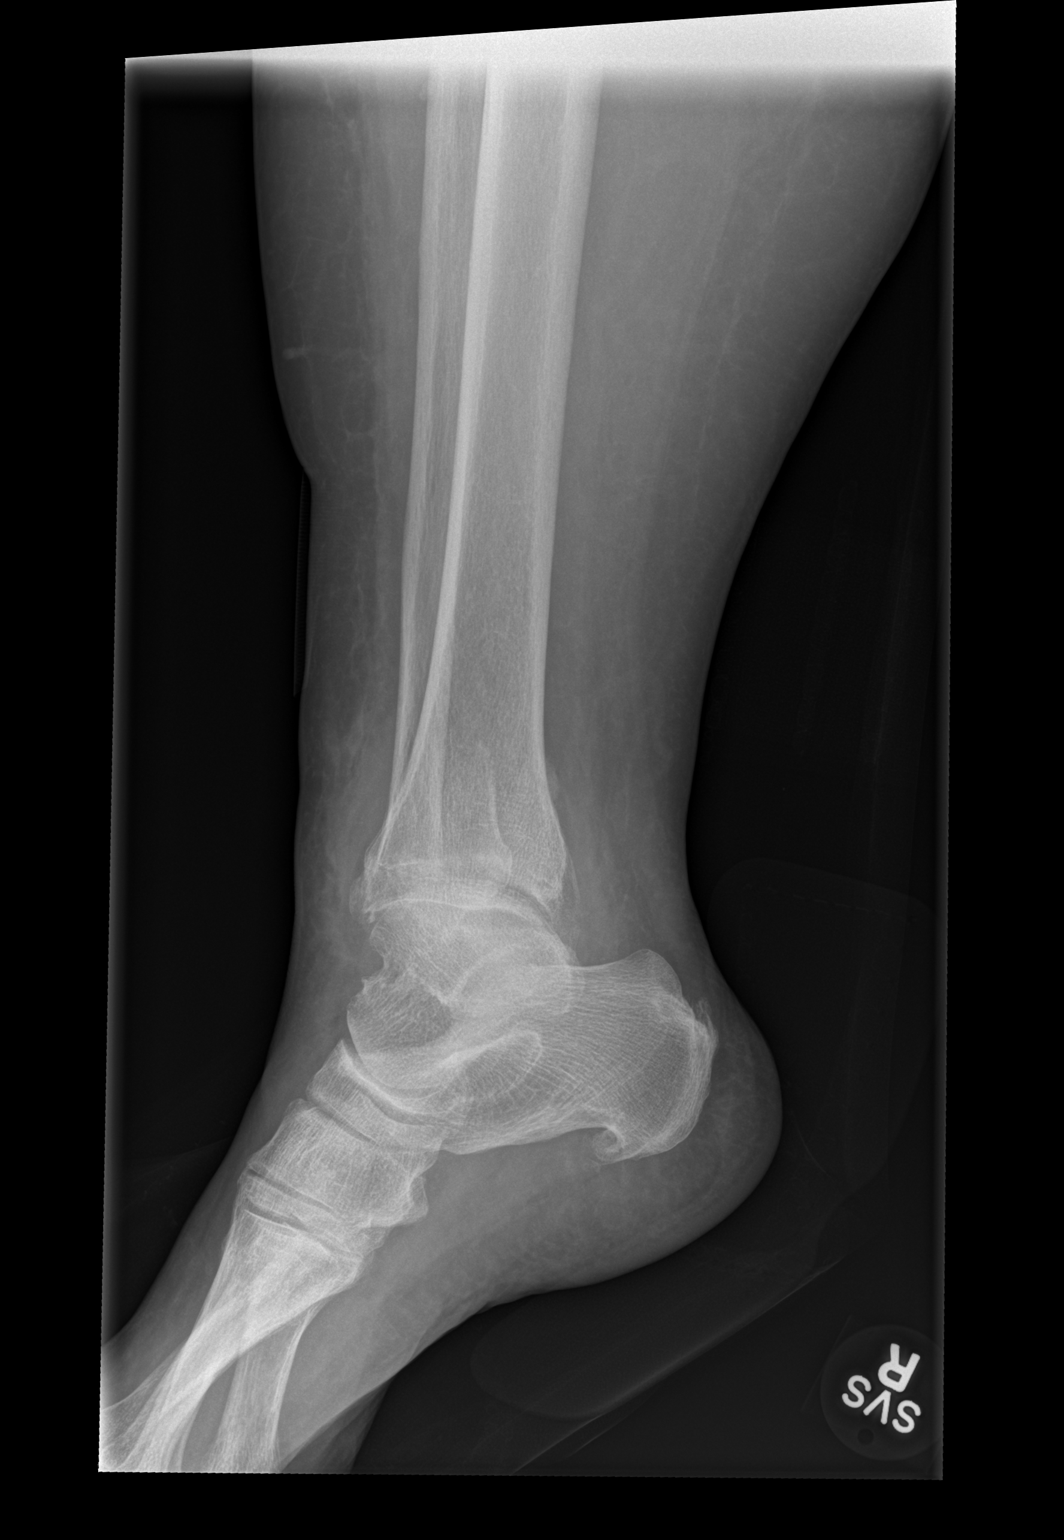

[x ankle obl right]
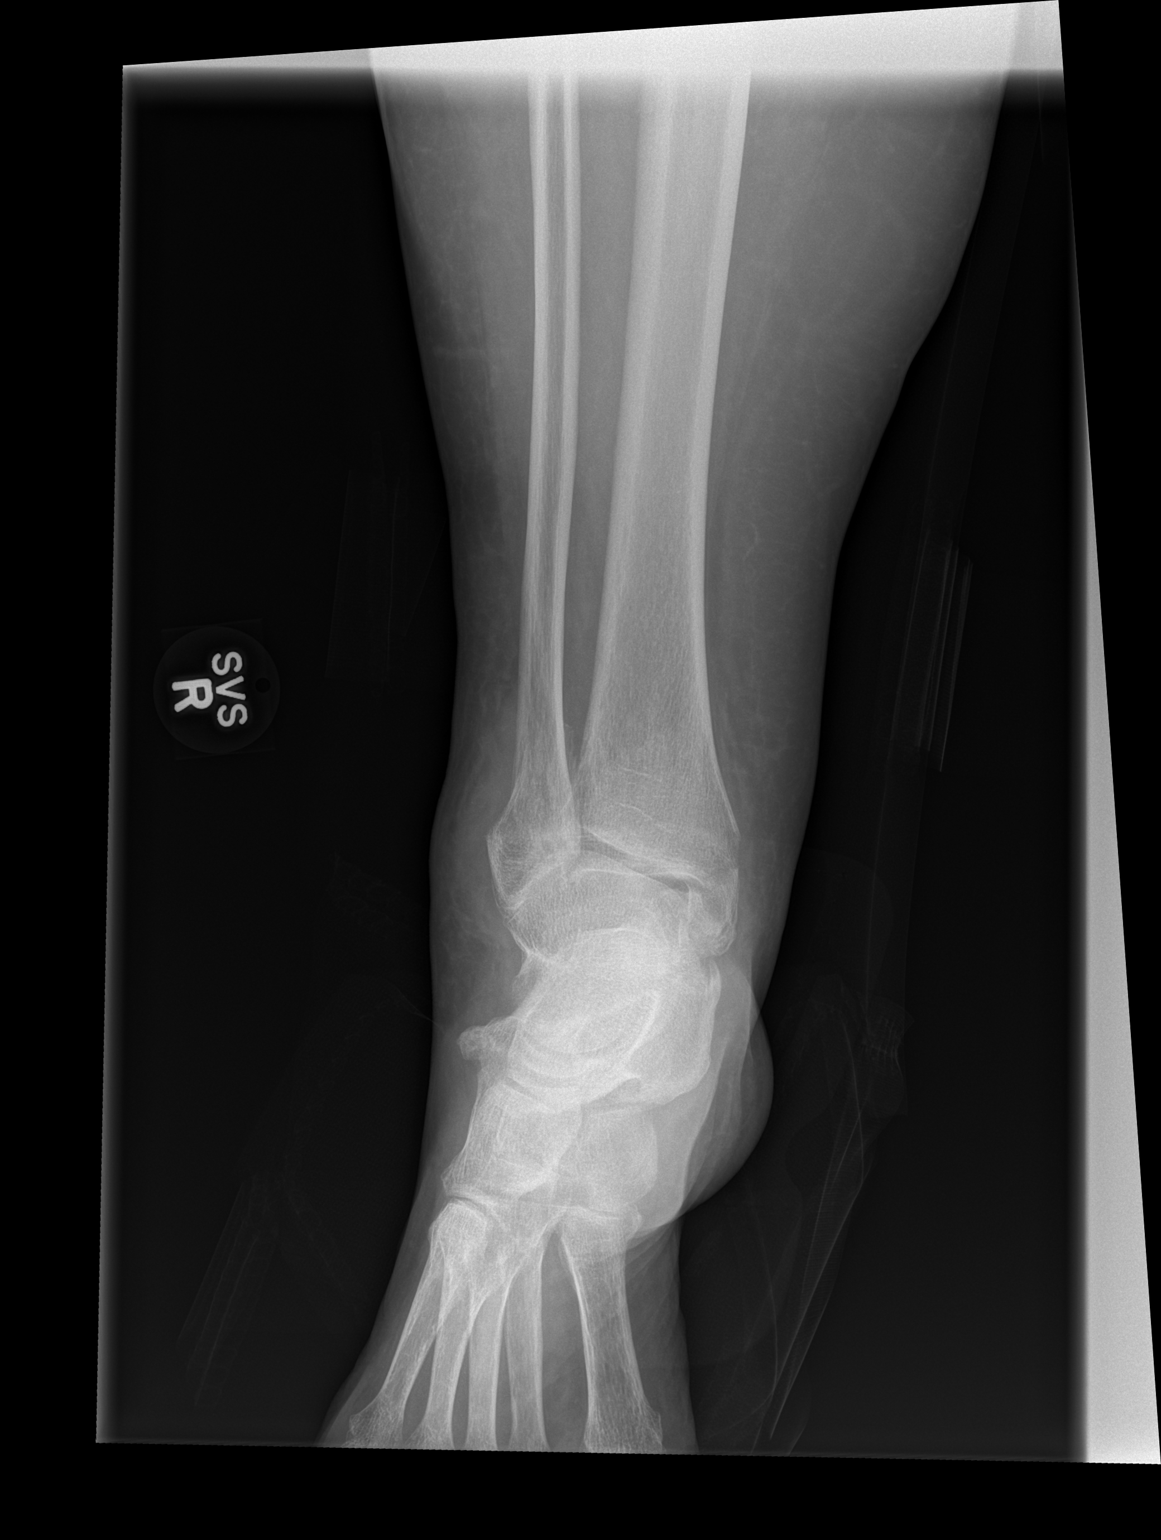

[x ankle lat right]
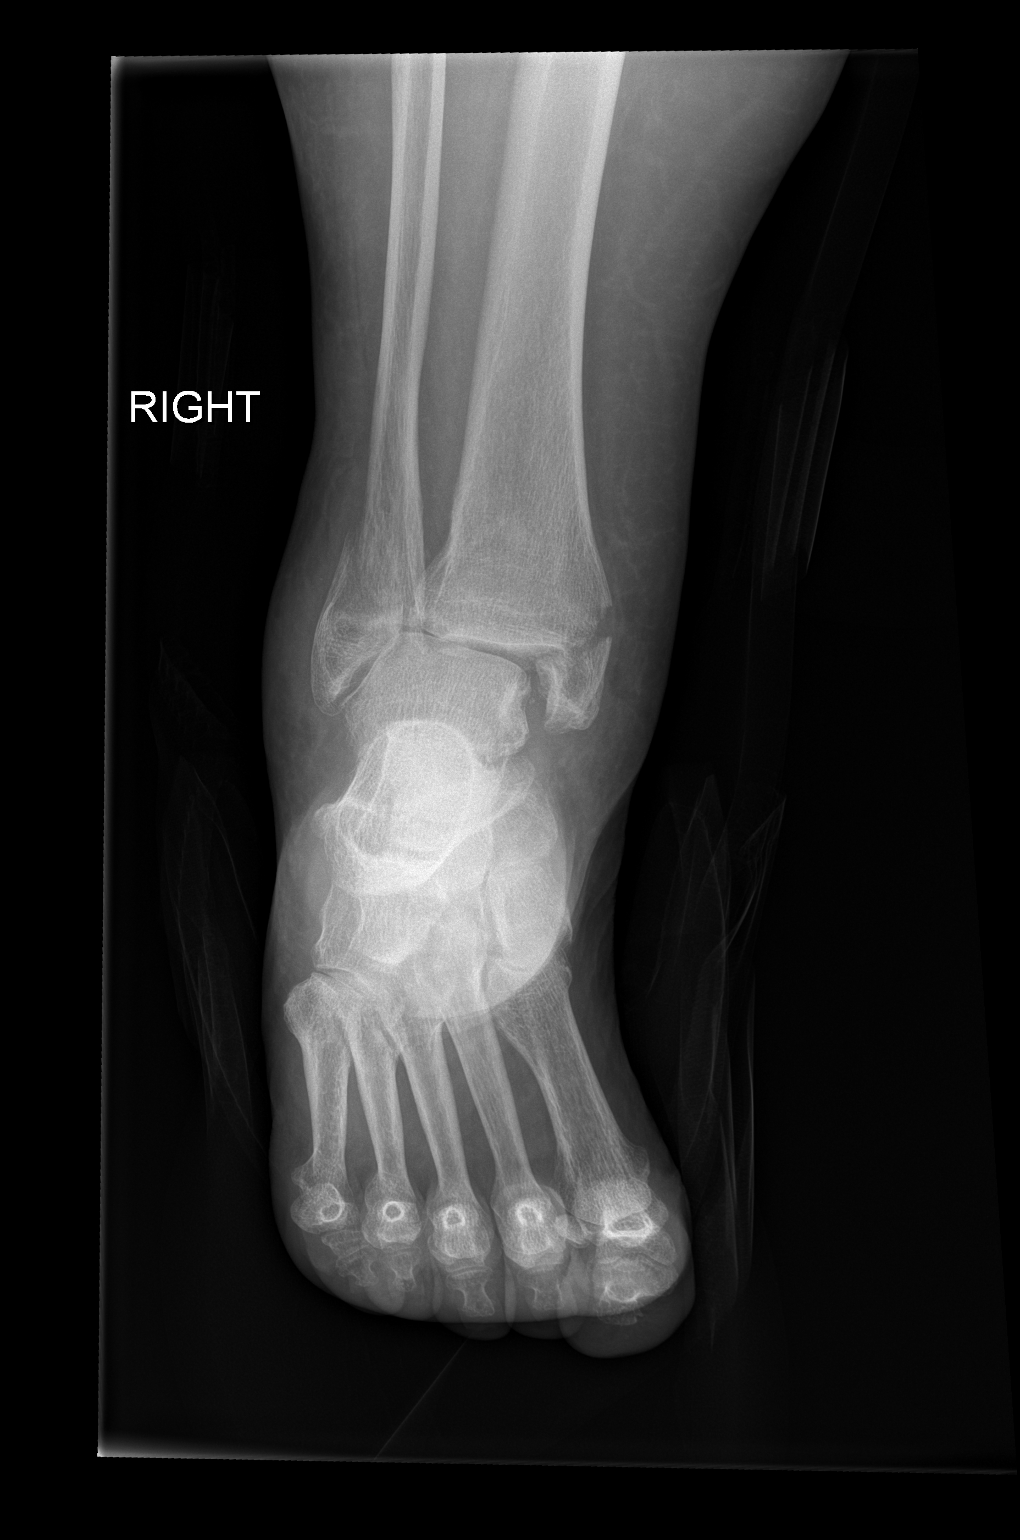

[3 of 3 positions shown; findings below may reference images not displayed]

FINDINGS: Severe soft tissue swelling present. Bimalleolar fractures are
present. The fractures are slightly displaced. Trimalleolar
component cannot be completely excluded. Diffuse osteopenia and
degenerative change present.
IMPRESSION: 1. Bimalleolar fracture. Fracture fragments are slightly displaced.
A subtle posterior malleolar component cannot be excluded.
2. DJD.

## 2015-06-20 IMAGING — CR DG KNEE COMPLETE 4+V*L*
4 series · 4 of 4 positions shown · non-contrast
Comparison: None.

CLINICAL DATA: Fall.  Left knee pain.

EXAM:
LEFT KNEE - COMPLETE 4+ VIEW

[x knee ap left (1 of 4)]
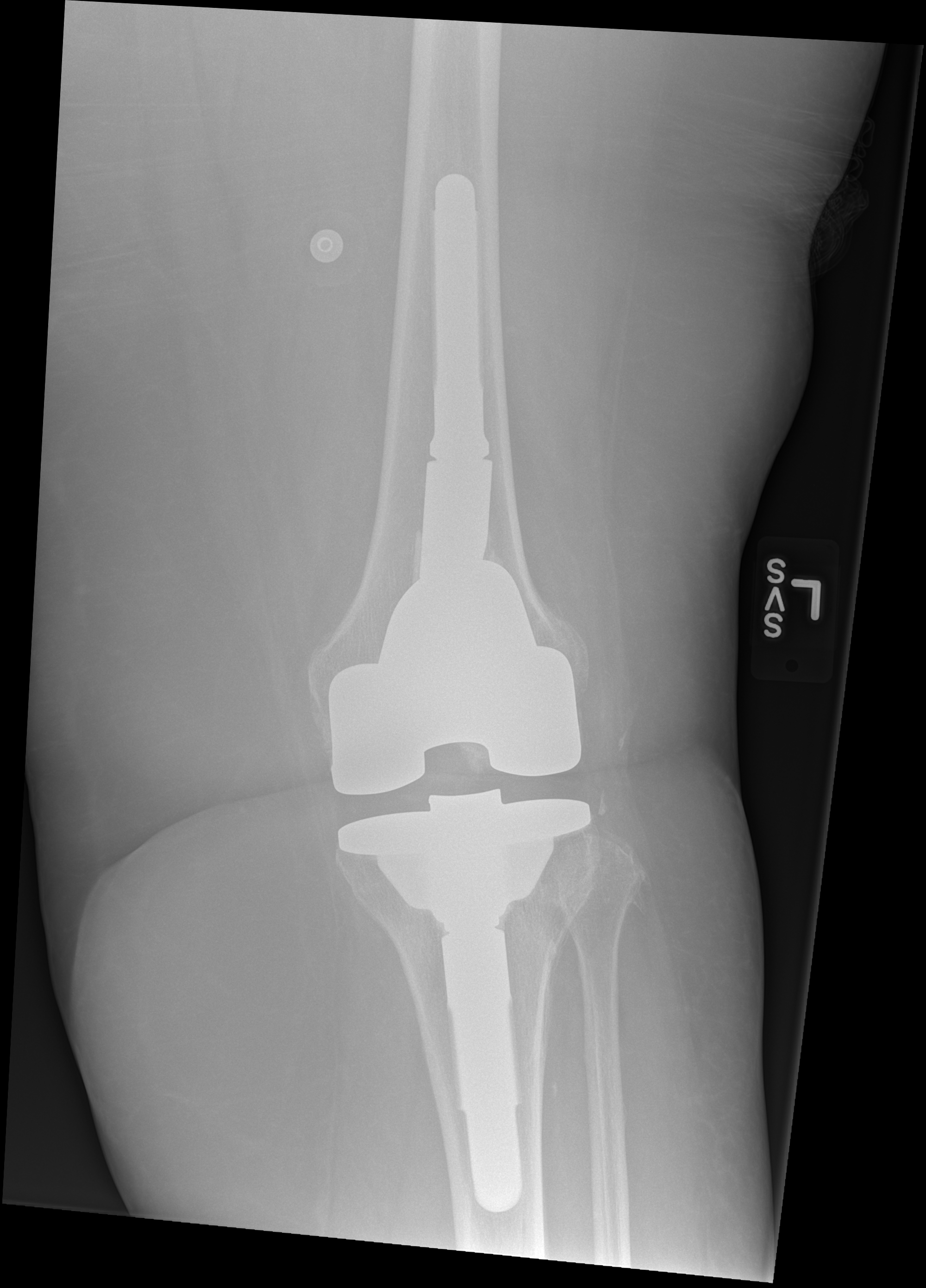

[x knee ap left (2 of 4)]
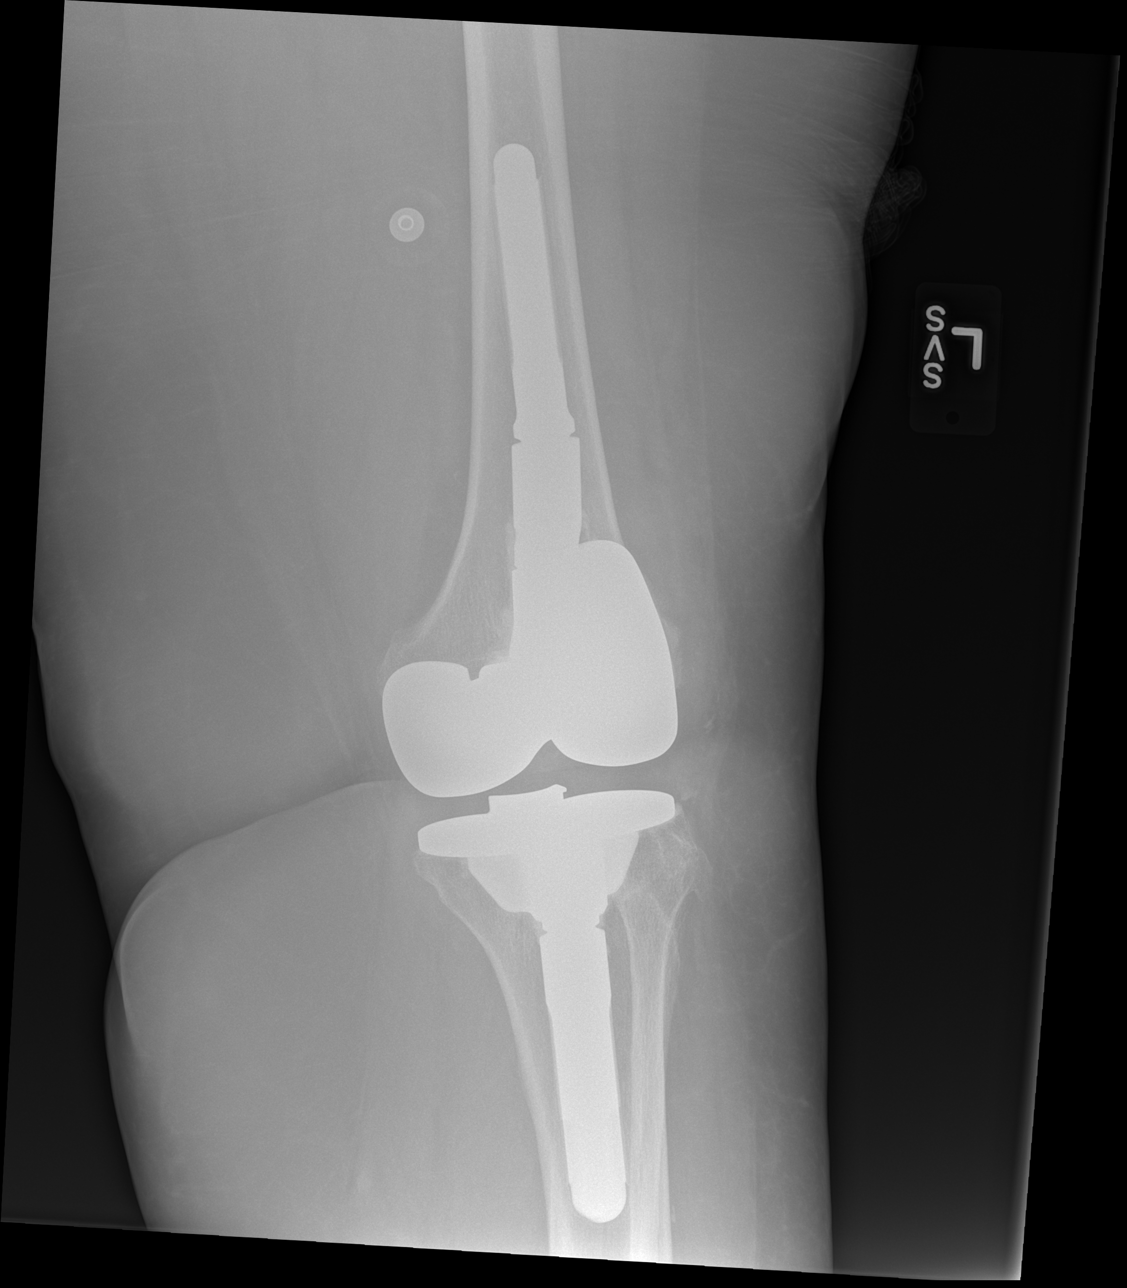

[x knee ap left (3 of 4)]
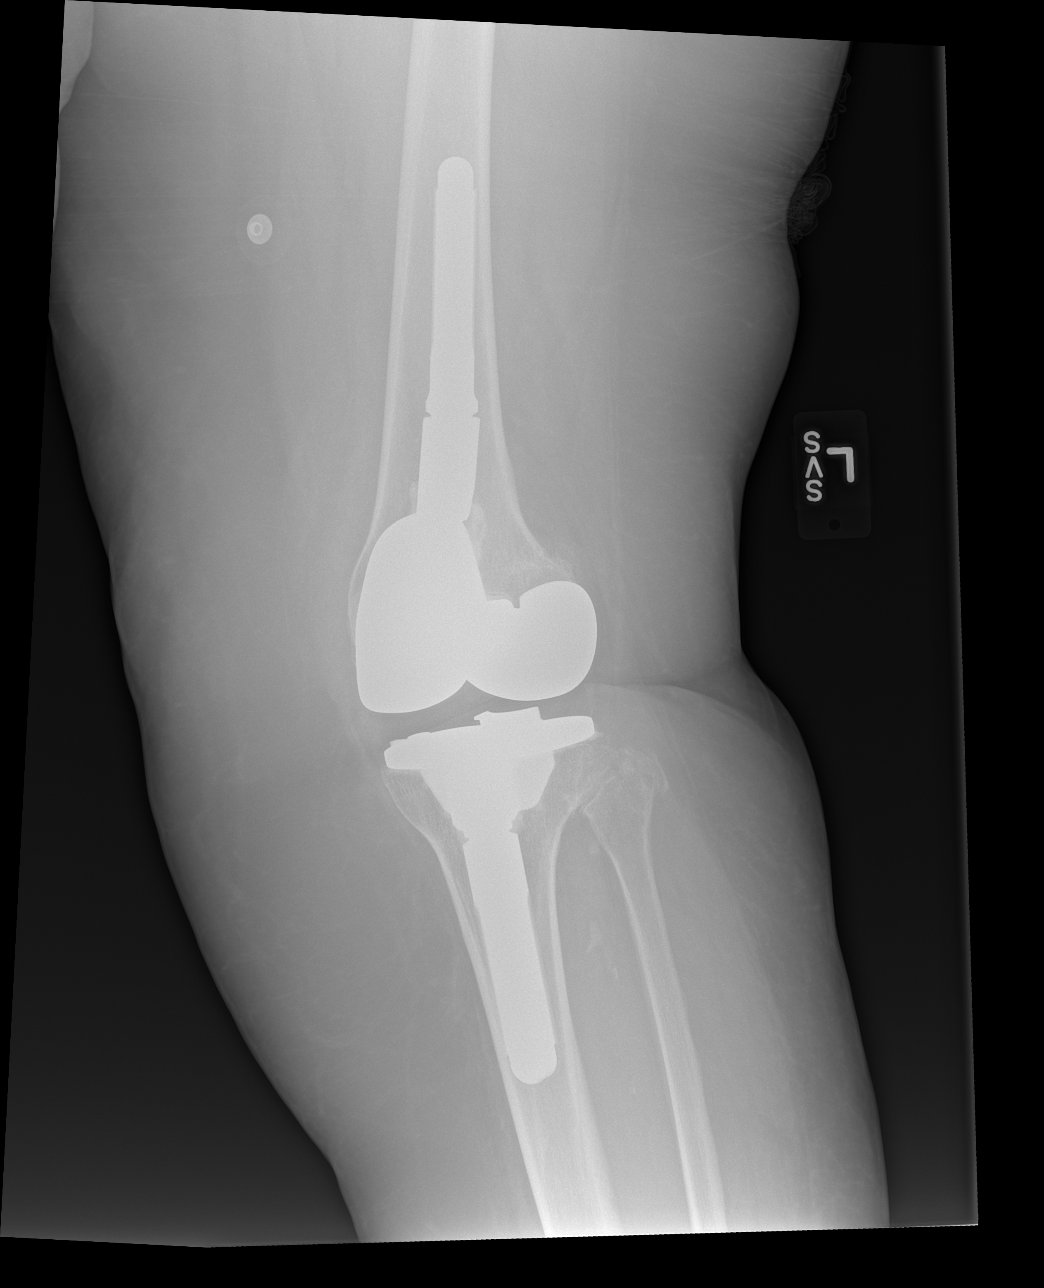

[x knee ap left (4 of 4)]
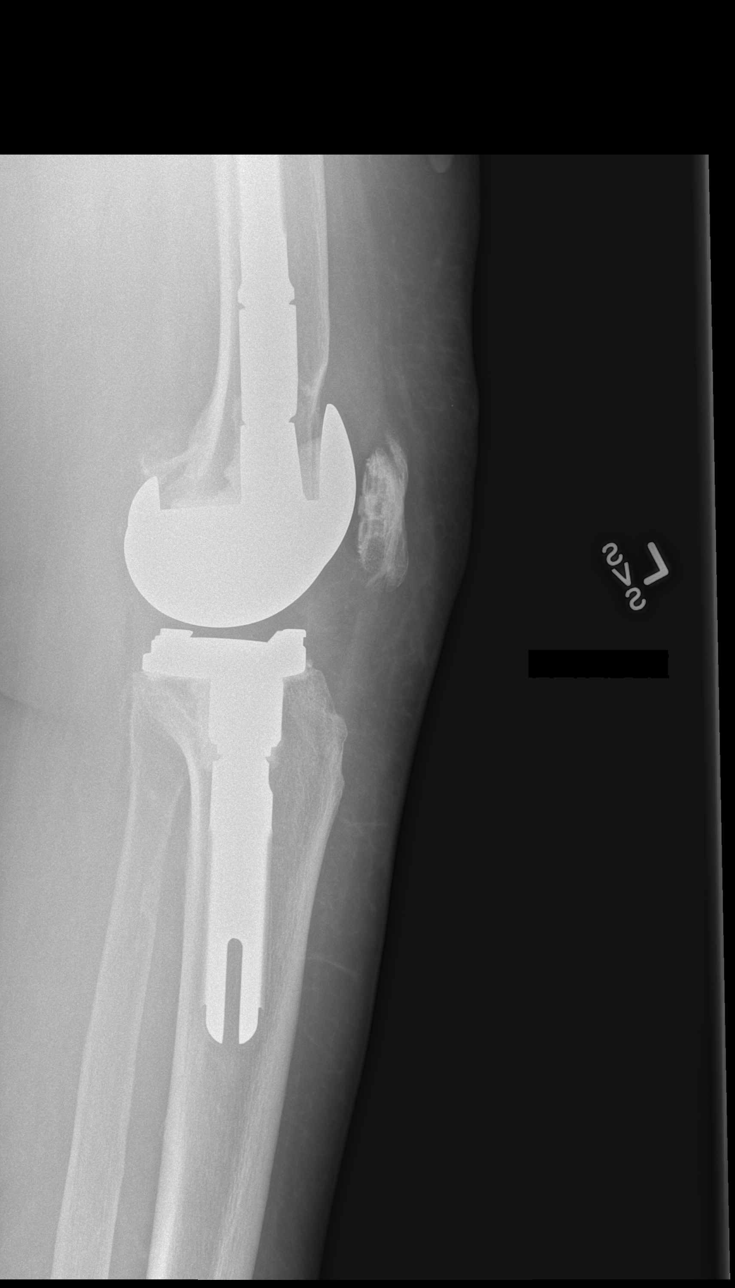

[4 of 4 positions shown; findings below may reference images not displayed]

FINDINGS: Changes of left knee replacement. No hardware or bony complicating
feature. No fracture, subluxation or dislocation.
IMPRESSION: No acute bony abnormality.
